# Patient Record
Sex: Male | Born: 1995 | Race: White | Hispanic: No | Marital: Single | Smoking: Never smoker
Health system: Southern US, Community
[De-identification: ages and names within clinical notes are randomized; demographics above are authoritative.]

---
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
## 2016-11-12 DIAGNOSIS — S40011A Contusion of right shoulder, initial encounter: Secondary | ICD-10-CM | POA: Diagnosis not present

## 2018-10-08 ENCOUNTER — Emergency Department (HOSPITAL_COMMUNITY)
Admission: EM | Admit: 2018-10-08 | Discharge: 2018-10-08 | Disposition: A | Discharge status: Home / Self Care | Payer: 59 | Attending: Emergency Medicine | Admitting: Emergency Medicine

## 2018-10-08 ENCOUNTER — Emergency Department (HOSPITAL_COMMUNITY): Payer: 59

## 2018-10-08 ENCOUNTER — Other Ambulatory Visit: Payer: Self-pay

## 2018-10-08 ENCOUNTER — Encounter (HOSPITAL_COMMUNITY): Payer: Self-pay

## 2018-10-08 DIAGNOSIS — S92425A Nondisplaced fracture of distal phalanx of left great toe, initial encounter for closed fracture: Secondary | ICD-10-CM | POA: Diagnosis not present

## 2018-10-08 DIAGNOSIS — Y929 Unspecified place or not applicable: Secondary | ICD-10-CM | POA: Insufficient documentation

## 2018-10-08 DIAGNOSIS — Y93B3 Activity, free weights: Secondary | ICD-10-CM | POA: Insufficient documentation

## 2018-10-08 DIAGNOSIS — Y999 Unspecified external cause status: Secondary | ICD-10-CM | POA: Diagnosis not present

## 2018-10-08 DIAGNOSIS — M79675 Pain in left toe(s): Secondary | ICD-10-CM | POA: Diagnosis not present

## 2018-10-08 DIAGNOSIS — W231XXA Caught, crushed, jammed, or pinched between stationary objects, initial encounter: Secondary | ICD-10-CM | POA: Insufficient documentation

## 2018-10-08 DIAGNOSIS — S91112A Laceration without foreign body of left great toe without damage to nail, initial encounter: Secondary | ICD-10-CM

## 2018-10-08 DIAGNOSIS — M7989 Other specified soft tissue disorders: Secondary | ICD-10-CM | POA: Diagnosis not present

## 2018-10-08 DIAGNOSIS — S91111A Laceration without foreign body of right great toe without damage to nail, initial encounter: Secondary | ICD-10-CM | POA: Diagnosis not present

## 2018-10-08 DIAGNOSIS — S99922A Unspecified injury of left foot, initial encounter: Secondary | ICD-10-CM | POA: Diagnosis present

## 2018-10-08 MED ORDER — LIDOCAINE HCL 2 % IJ SOLN
20.0000 mL | Freq: Once | INTRAMUSCULAR | Status: AC
  Administered 2018-10-08: 400 mg via INTRADERMAL
  Filled 2018-10-08: qty 20

## 2018-10-08 NOTE — ED Provider Notes (Signed)
[facility]-EMERGENCY DEPT Provider Note   CSN: [id number] Arrival date & time: 10/08/18  1929     History   Chief Complaint Chief Complaint  Patient presents with  . Toe Injury    L great toe    HPI Juan Ellis is a 23 y.o. male.     HPI Patient presents to the emergency department with laceration and toe injury that occurred just prior to arrival.  The patient states he was lifting weights and he dropped a 45 pound weight on his left great toe.  The patient states that he has a laceration which he applied pressure to.  Patient states that palpation makes the pain worse.  Patient denies any other injuries. History reviewed. No pertinent past medical history.  There are no active problems to display for this patient.   History reviewed. No pertinent surgical history.      Home Medications    Prior to Admission medications   Not on File    Family History Family History  Problem Relation Age of Onset  . Hypertension Mother   . Mitral valve prolapse Father   . Seizures Sister   . Kidney disease Paternal Grandfather     Social History Social History   Tobacco Use  . Smoking status: Never Smoker  . Smokeless tobacco: Never Used  Substance Use Topics  . Alcohol use: No  . Drug use: No     Allergies   Patient has no known allergies.   Review of Systems Review of Systems  All other systems negative except as documented in the HPI. All pertinent positives and negatives as reviewed in the HPI. Physical Exam Updated Vital Signs BP (!) 147/91 (BP Location: Left Arm)   Pulse 88   Temp 98.3 F (36.8 C) (Oral)   Resp 18   SpO2 99%   Physical Exam Vitals signs and nursing note reviewed.  Constitutional:      General: He is not in acute distress.    Appearance: He is well-developed.  HENT:     Head: Normocephalic and atraumatic.  Eyes:     Pupils: Pupils are equal, round, and reactive to light.  Pulmonary:     Effort:  Pulmonary effort is normal.  Musculoskeletal:       Feet:  Skin:    General: Skin is warm and dry.  Neurological:     Mental Status: He is alert and oriented to person, place, and time.      ED Treatments / Results  Labs (all labs ordered are listed, but only abnormal results are displayed) Labs Reviewed - No data to display  EKG None  Radiology Dg Toe Great Left  Result Date: 10/08/2018 CLINICAL DATA:  Crush injury, pain EXAM: LEFT GREAT TOE COMPARISON:  None. FINDINGS: There is a small cortical defect/erosion seen within the first metatarsal head at the articular surface. The joint spaces are well preserved. No dislocation seen. Diffuse dorsal soft tissue swelling is seen. IMPRESSION: Small nonspecific defect in the first metatarsal head which could be from chip fracture. Electronically Signed   By: Jonna ClarkBindu  Avutu M.D.   On: 10/08/2018 21:25    Procedures Procedures (including critical care time)  Medications Ordered in ED Medications  lidocaine (XYLOCAINE) 2 % (with pres) injection 400 mg (has no administration in time range)     Initial Impression / Assessment and Plan / ED Course  I have reviewed the triage vital signs and the nursing notes.  Pertinent labs &  imaging results that were available during my care of the patient were reviewed by me and considered in my medical decision making (see chart for details).        Patient has a chip fracture.  There is 2 wounds the more proximal wound is not deep but superficial.  LACERATION REPAIR Performed by: Brent [facility] Authorized by: Resa Miner Claudell Rhody Consent: Verbal consent obtained. Risks and benefits: risks, benefits and alternatives were discussed Consent given by: patient Patient identity confirmed: provided demographic data Prepped and Draped in normal sterile fashion Wound explored  Laceration Location: Medial interphalangeal joint  Laceration Length: 3 cm  No Foreign Bodies seen or palpated   Anesthesia: local infiltration  Local anesthetic: lidocaine 2% without epinephrine  Anesthetic total: 5 ml  Irrigation method: syringe Amount of cleaning: standard  Skin closure: 4-0 Prolene  Number of sutures: 4  Technique: Simple interrupted  Patient tolerance: Patient tolerated the procedure well with no immediate complications.  Patient advised to follow-up with orthopedics.  Told to return here as needed.  Advised patient to keep the area clean and dry. Final Clinical Impressions(s) / ED Diagnoses   Final diagnoses:  None    ED Discharge Orders    None       Dalia Heading, PA-C 10/08/18 [place], [place], DO 10/09/18 1628

## 2018-10-08 NOTE — ED Triage Notes (Signed)
Pt reports that he dropped a 45 lb weight on his L toe at the gym about 1 hour ago. Reports a laceration with controlled bleeding. States that he poured hydrogen peroxide on it. Unsure of last tetanus shot.

## 2018-10-08 NOTE — Discharge Instructions (Addendum)
Follow-up with your orthopedist.  Return here as needed.  Keep the area clean and dry.  Have sutures out in 10 to 14 days.

## 2018-10-10 DIAGNOSIS — H5213 Myopia, bilateral: Secondary | ICD-10-CM | POA: Diagnosis not present

## 2018-12-30 DIAGNOSIS — M9903 Segmental and somatic dysfunction of lumbar region: Secondary | ICD-10-CM | POA: Diagnosis not present

## 2018-12-30 DIAGNOSIS — M7542 Impingement syndrome of left shoulder: Secondary | ICD-10-CM | POA: Diagnosis not present

## 2018-12-30 DIAGNOSIS — M545 Low back pain: Secondary | ICD-10-CM | POA: Diagnosis not present

## 2018-12-30 DIAGNOSIS — M546 Pain in thoracic spine: Secondary | ICD-10-CM | POA: Diagnosis not present

## 2018-12-30 DIAGNOSIS — M9901 Segmental and somatic dysfunction of cervical region: Secondary | ICD-10-CM | POA: Diagnosis not present

## 2018-12-30 DIAGNOSIS — M7918 Myalgia, other site: Secondary | ICD-10-CM | POA: Diagnosis not present

## 2018-12-30 DIAGNOSIS — M9902 Segmental and somatic dysfunction of thoracic region: Secondary | ICD-10-CM | POA: Diagnosis not present

## 2018-12-30 DIAGNOSIS — M542 Cervicalgia: Secondary | ICD-10-CM | POA: Diagnosis not present

## 2018-12-30 DIAGNOSIS — M9905 Segmental and somatic dysfunction of pelvic region: Secondary | ICD-10-CM | POA: Diagnosis not present

## 2019-01-10 DIAGNOSIS — M546 Pain in thoracic spine: Secondary | ICD-10-CM | POA: Diagnosis not present

## 2019-01-10 DIAGNOSIS — M9903 Segmental and somatic dysfunction of lumbar region: Secondary | ICD-10-CM | POA: Diagnosis not present

## 2019-01-10 DIAGNOSIS — M9902 Segmental and somatic dysfunction of thoracic region: Secondary | ICD-10-CM | POA: Diagnosis not present

## 2019-01-10 DIAGNOSIS — M542 Cervicalgia: Secondary | ICD-10-CM | POA: Diagnosis not present

## 2019-01-10 DIAGNOSIS — M545 Low back pain: Secondary | ICD-10-CM | POA: Diagnosis not present

## 2019-01-10 DIAGNOSIS — M9905 Segmental and somatic dysfunction of pelvic region: Secondary | ICD-10-CM | POA: Diagnosis not present

## 2019-01-10 DIAGNOSIS — M9901 Segmental and somatic dysfunction of cervical region: Secondary | ICD-10-CM | POA: Diagnosis not present

## 2019-01-10 DIAGNOSIS — M7542 Impingement syndrome of left shoulder: Secondary | ICD-10-CM | POA: Diagnosis not present

## 2019-01-10 DIAGNOSIS — M7918 Myalgia, other site: Secondary | ICD-10-CM | POA: Diagnosis not present

## 2019-02-03 ENCOUNTER — Ambulatory Visit: Payer: Self-pay | Attending: Internal Medicine

## 2019-02-03 DIAGNOSIS — Z20822 Contact with and (suspected) exposure to covid-19: Secondary | ICD-10-CM

## 2019-02-04 LAB — NOVEL CORONAVIRUS, NAA: SARS-CoV-2, NAA: NOT DETECTED

## 2019-02-08 ENCOUNTER — Ambulatory Visit: Payer: Self-pay | Attending: Internal Medicine

## 2019-02-08 DIAGNOSIS — Z20822 Contact with and (suspected) exposure to covid-19: Secondary | ICD-10-CM | POA: Insufficient documentation

## 2019-02-09 LAB — NOVEL CORONAVIRUS, NAA: SARS-CoV-2, NAA: NOT DETECTED
